# Patient Record
Sex: Female | Born: 1955 | Race: White | Hispanic: No | Marital: Married | Smoking: Former smoker
Health system: Southern US, Community
[De-identification: ages and names within clinical notes are randomized; demographics above are authoritative.]

## PROBLEM LIST (undated history)

## (undated) DIAGNOSIS — F32A Depression, unspecified: Secondary | ICD-10-CM

## (undated) DIAGNOSIS — F419 Anxiety disorder, unspecified: Secondary | ICD-10-CM

## (undated) DIAGNOSIS — E559 Vitamin D deficiency, unspecified: Secondary | ICD-10-CM

## (undated) DIAGNOSIS — G2111 Neuroleptic induced parkinsonism: Secondary | ICD-10-CM

## (undated) HISTORY — DX: Neuroleptic induced parkinsonism: G21.11

## (undated) HISTORY — DX: Anxiety disorder, unspecified: F41.9

## (undated) HISTORY — PX: MOUTH SURGERY: SHX715

## (undated) HISTORY — DX: Vitamin D deficiency, unspecified: E55.9

## (undated) HISTORY — PX: TONSILLECTOMY: SUR1361

## (undated) HISTORY — DX: Depression, unspecified: F32.A

---

## 1999-03-07 ENCOUNTER — Encounter: Payer: Self-pay | Admitting: Obstetrics and Gynecology

## 1999-03-07 ENCOUNTER — Encounter: Admission: RE | Admit: 1999-03-07 | Discharge: 1999-03-07 | Payer: Self-pay | Admitting: Obstetrics and Gynecology

## 2000-05-14 ENCOUNTER — Encounter: Admission: RE | Admit: 2000-05-14 | Discharge: 2000-05-14 | Payer: Self-pay | Admitting: Obstetrics and Gynecology

## 2000-05-14 ENCOUNTER — Encounter: Payer: Self-pay | Admitting: Obstetrics and Gynecology

## 2001-05-18 ENCOUNTER — Encounter: Payer: Self-pay | Admitting: Obstetrics and Gynecology

## 2001-05-18 ENCOUNTER — Encounter: Admission: RE | Admit: 2001-05-18 | Discharge: 2001-05-18 | Payer: Self-pay | Admitting: Obstetrics and Gynecology

## 2002-09-09 ENCOUNTER — Encounter: Payer: Self-pay | Admitting: Obstetrics and Gynecology

## 2002-09-09 ENCOUNTER — Encounter: Admission: RE | Admit: 2002-09-09 | Discharge: 2002-09-09 | Payer: Self-pay | Admitting: Obstetrics and Gynecology

## 2002-09-19 ENCOUNTER — Other Ambulatory Visit: Admission: RE | Admit: 2002-09-19 | Discharge: 2002-09-19 | Payer: Self-pay | Admitting: Obstetrics and Gynecology

## 2003-09-19 ENCOUNTER — Ambulatory Visit (HOSPITAL_COMMUNITY): Admission: RE | Admit: 2003-09-19 | Discharge: 2003-09-19 | Payer: Self-pay | Admitting: Obstetrics and Gynecology

## 2005-01-07 ENCOUNTER — Ambulatory Visit (HOSPITAL_COMMUNITY): Admission: RE | Admit: 2005-01-07 | Discharge: 2005-01-07 | Payer: Self-pay | Admitting: Family Medicine

## 2006-03-18 ENCOUNTER — Ambulatory Visit (HOSPITAL_COMMUNITY): Admission: RE | Admit: 2006-03-18 | Discharge: 2006-03-18 | Payer: Self-pay | Admitting: Obstetrics & Gynecology

## 2008-02-10 ENCOUNTER — Ambulatory Visit (HOSPITAL_COMMUNITY): Admission: RE | Admit: 2008-02-10 | Discharge: 2008-02-10 | Payer: Self-pay | Admitting: Family Medicine

## 2010-05-26 ENCOUNTER — Encounter: Payer: Self-pay | Admitting: Family Medicine

## 2010-08-12 ENCOUNTER — Other Ambulatory Visit (HOSPITAL_COMMUNITY): Payer: Self-pay | Admitting: Family Medicine

## 2010-08-12 DIAGNOSIS — Z1231 Encounter for screening mammogram for malignant neoplasm of breast: Secondary | ICD-10-CM

## 2010-08-20 ENCOUNTER — Ambulatory Visit (HOSPITAL_COMMUNITY): Payer: Self-pay

## 2012-12-20 ENCOUNTER — Other Ambulatory Visit (HOSPITAL_COMMUNITY): Payer: Self-pay | Admitting: Family Medicine

## 2012-12-20 DIAGNOSIS — Z1231 Encounter for screening mammogram for malignant neoplasm of breast: Secondary | ICD-10-CM

## 2012-12-22 ENCOUNTER — Ambulatory Visit (HOSPITAL_COMMUNITY): Payer: Self-pay

## 2013-01-05 ENCOUNTER — Ambulatory Visit (HOSPITAL_COMMUNITY)
Admission: RE | Admit: 2013-01-05 | Discharge: 2013-01-05 | Disposition: A | Discharge status: Home / Self Care | Payer: BC Managed Care – PPO | Source: Ambulatory Visit | Attending: Family Medicine | Admitting: Family Medicine

## 2013-01-05 DIAGNOSIS — Z1231 Encounter for screening mammogram for malignant neoplasm of breast: Secondary | ICD-10-CM

## 2014-08-08 ENCOUNTER — Other Ambulatory Visit (HOSPITAL_COMMUNITY): Payer: Self-pay | Admitting: Family Medicine

## 2014-08-08 DIAGNOSIS — Z1231 Encounter for screening mammogram for malignant neoplasm of breast: Secondary | ICD-10-CM

## 2014-08-21 ENCOUNTER — Ambulatory Visit (HOSPITAL_COMMUNITY)
Admission: RE | Admit: 2014-08-21 | Discharge: 2014-08-21 | Disposition: A | Discharge status: Home / Self Care | Payer: BLUE CROSS/BLUE SHIELD | Source: Ambulatory Visit | Attending: Family Medicine | Admitting: Family Medicine

## 2014-08-21 DIAGNOSIS — Z1231 Encounter for screening mammogram for malignant neoplasm of breast: Secondary | ICD-10-CM | POA: Diagnosis not present

## 2016-07-08 ENCOUNTER — Other Ambulatory Visit: Payer: Self-pay | Admitting: Family Medicine

## 2016-07-08 DIAGNOSIS — Z1231 Encounter for screening mammogram for malignant neoplasm of breast: Secondary | ICD-10-CM

## 2016-07-29 ENCOUNTER — Ambulatory Visit
Admission: RE | Admit: 2016-07-29 | Discharge: 2016-07-29 | Disposition: A | Discharge status: Home / Self Care | Payer: Commercial Managed Care - HMO | Source: Ambulatory Visit | Attending: Family Medicine | Admitting: Family Medicine

## 2016-07-29 DIAGNOSIS — Z1231 Encounter for screening mammogram for malignant neoplasm of breast: Secondary | ICD-10-CM

## 2017-06-22 ENCOUNTER — Other Ambulatory Visit: Payer: Self-pay | Admitting: Family Medicine

## 2017-06-22 DIAGNOSIS — Z139 Encounter for screening, unspecified: Secondary | ICD-10-CM

## 2017-06-22 DIAGNOSIS — E2839 Other primary ovarian failure: Secondary | ICD-10-CM

## 2017-07-30 ENCOUNTER — Other Ambulatory Visit: Payer: Commercial Managed Care - HMO

## 2017-07-30 ENCOUNTER — Ambulatory Visit: Payer: Commercial Managed Care - HMO

## 2017-07-31 ENCOUNTER — Other Ambulatory Visit: Payer: Self-pay | Admitting: Internal Medicine

## 2017-08-03 ENCOUNTER — Ambulatory Visit
Admission: RE | Admit: 2017-08-03 | Discharge: 2017-08-03 | Disposition: A | Discharge status: Home / Self Care | Payer: BLUE CROSS/BLUE SHIELD | Source: Ambulatory Visit | Attending: Family Medicine | Admitting: Family Medicine

## 2017-08-03 ENCOUNTER — Ambulatory Visit
Admission: RE | Admit: 2017-08-03 | Discharge: 2017-08-03 | Disposition: A | Discharge status: Home / Self Care | Payer: Commercial Managed Care - HMO | Source: Ambulatory Visit | Attending: Family Medicine | Admitting: Family Medicine

## 2017-08-03 DIAGNOSIS — E2839 Other primary ovarian failure: Secondary | ICD-10-CM

## 2017-08-03 DIAGNOSIS — Z139 Encounter for screening, unspecified: Secondary | ICD-10-CM

## 2017-08-04 ENCOUNTER — Other Ambulatory Visit: Payer: Self-pay | Admitting: Family Medicine

## 2018-10-21 IMAGING — MG DIGITAL SCREENING BILATERAL MAMMOGRAM WITH TOMO AND CAD
8 series · 9 of 24 positions shown · non-contrast
Comparison: Previous exam(s).

CLINICAL DATA: Screening.

EXAM:
DIGITAL SCREENING BILATERAL MAMMOGRAM WITH TOMO AND CAD

[L MLO synth-2D]
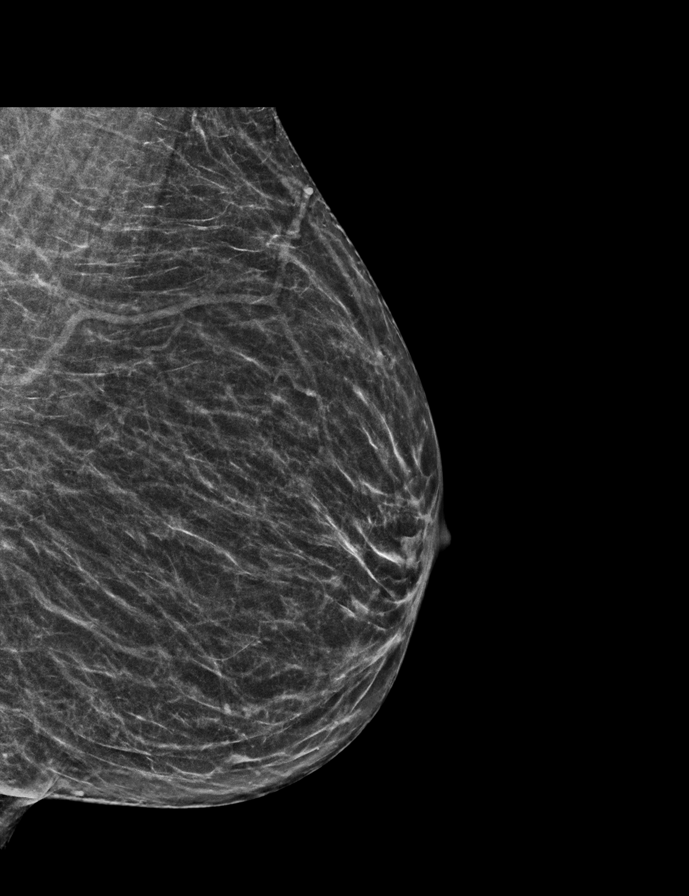

[L CC synth-2D]
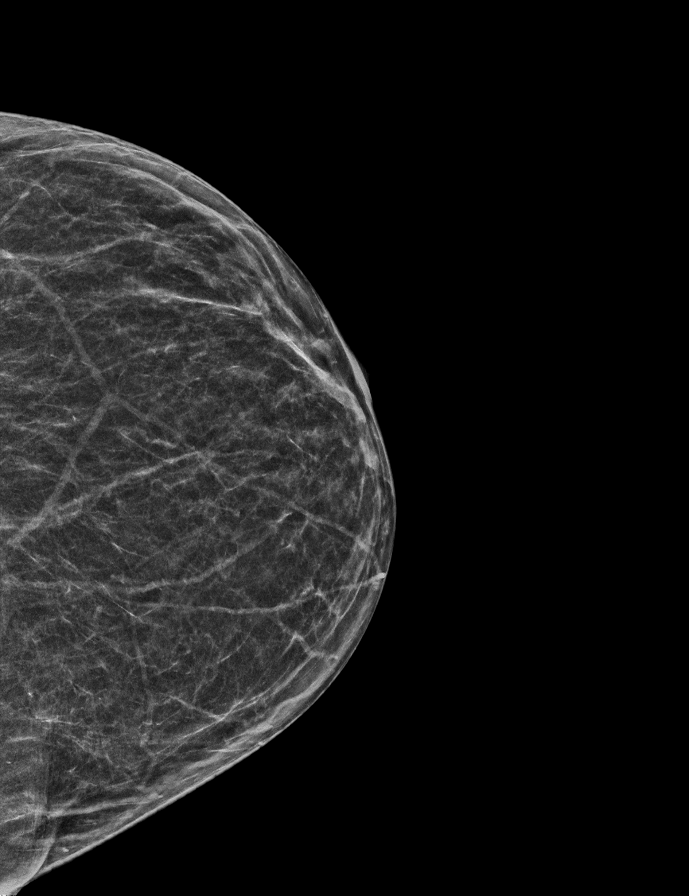

[R MLO synth-2D]
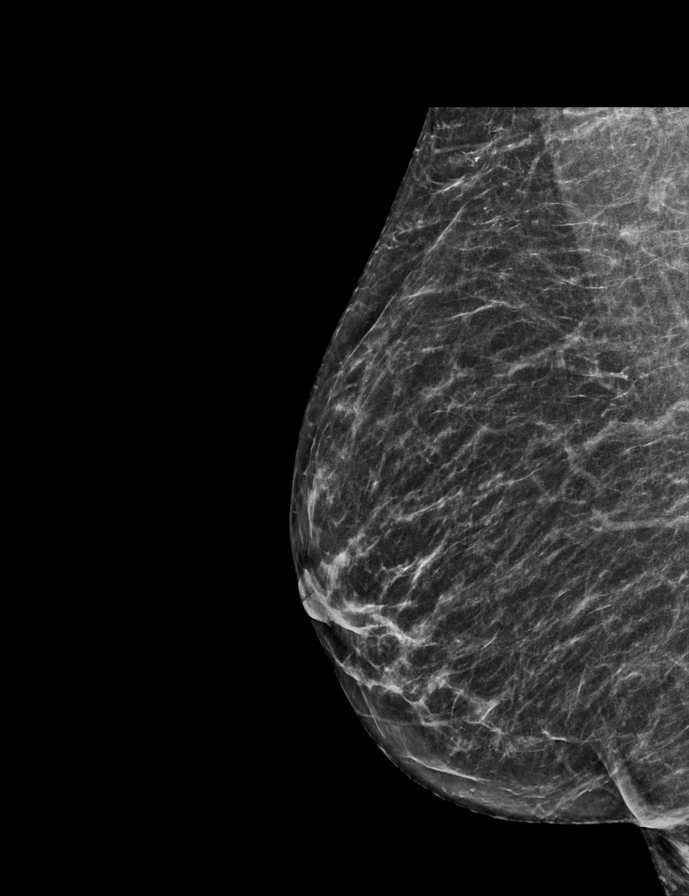

[R CC synth-2D]
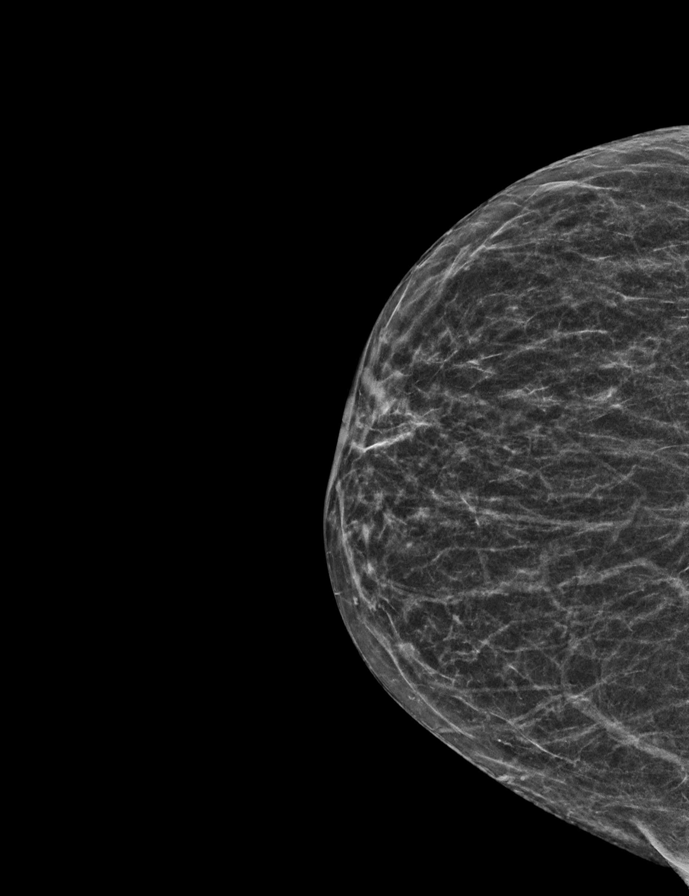

[L MLO tomo · 2 of 51 frames shown]
[frame 17/51]
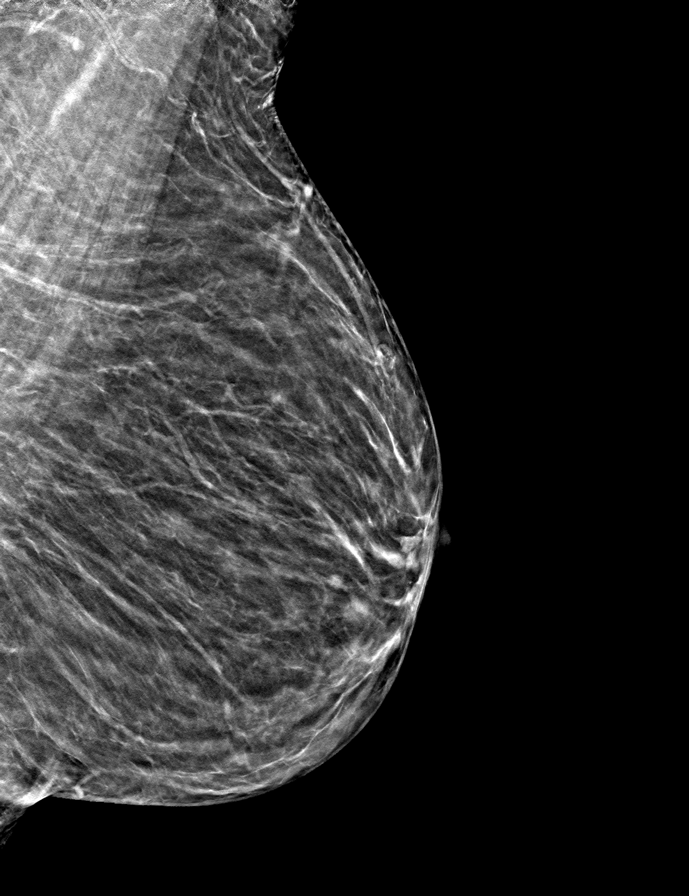
[frame 26/51]
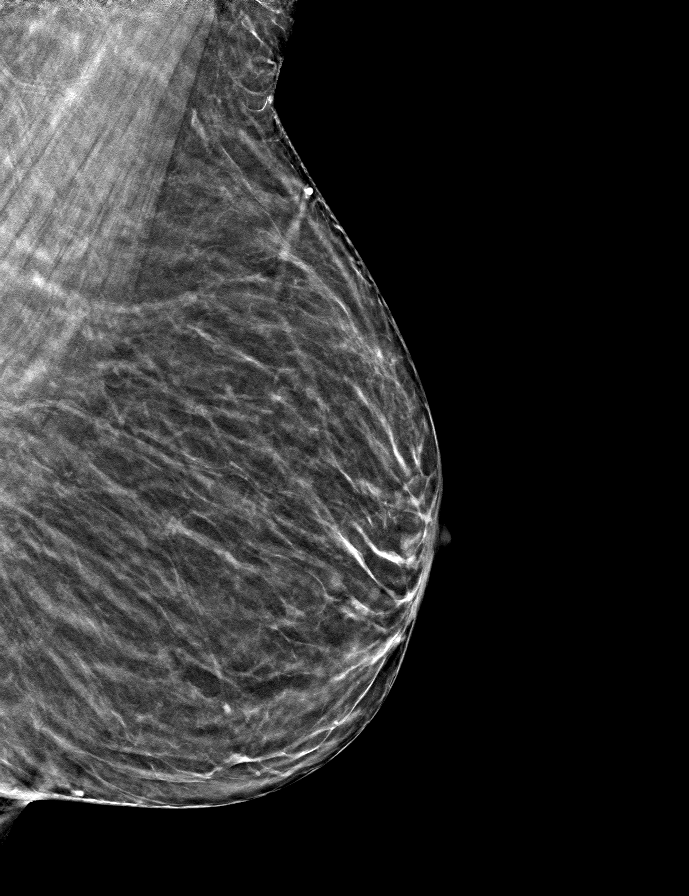

[R CC tomo · tomo slice 23/46.0]
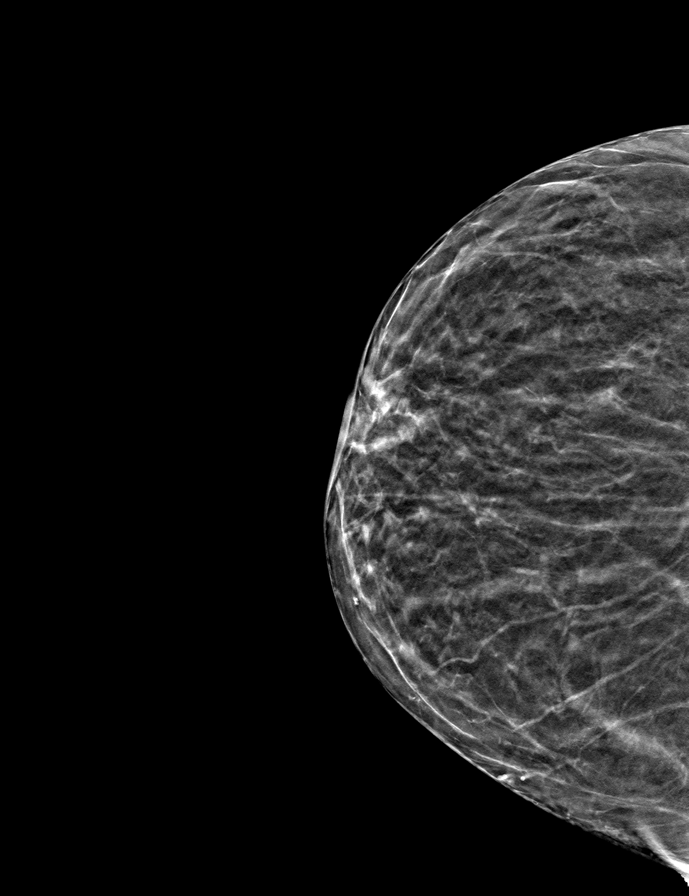

[R MLO tomo · tomo slice 27/54.0]
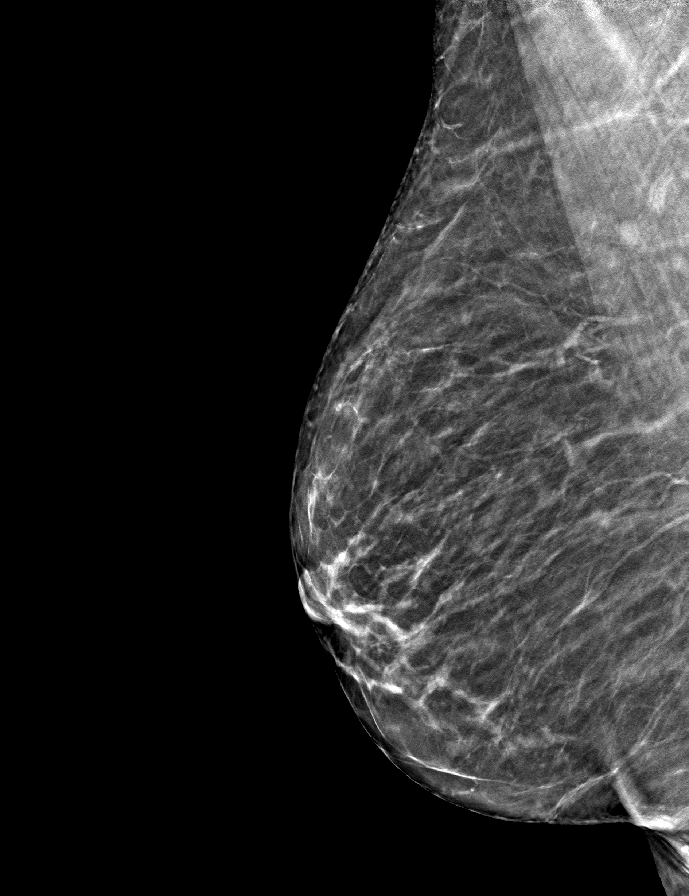

[L CC tomo · tomo slice 24/47.0]
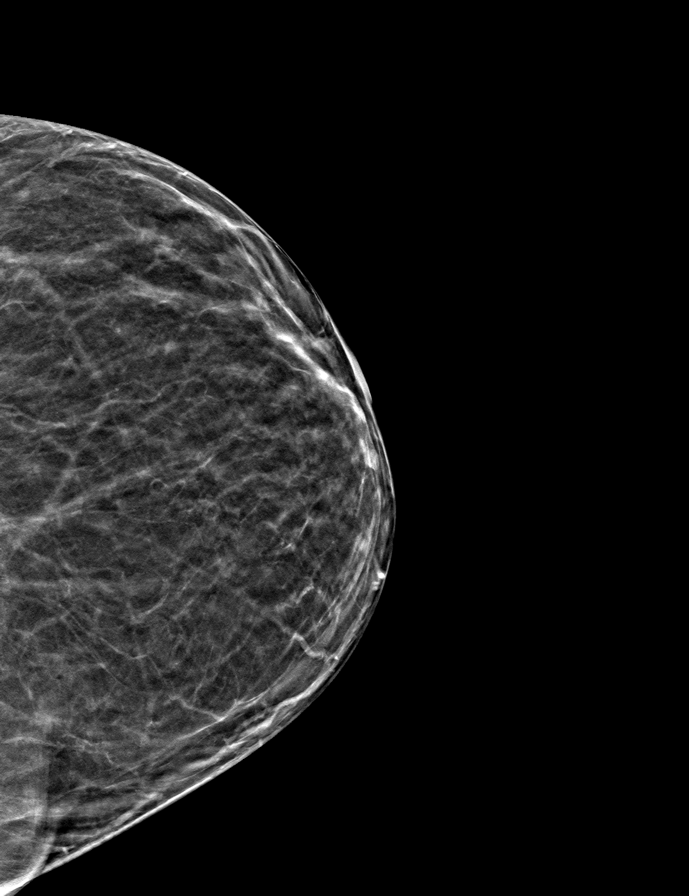

[9 of 24 positions shown; findings below may reference images not displayed]

ACR Breast Density Category b: There are scattered areas of
fibroglandular density.
FINDINGS: There are no findings suspicious for malignancy. Images were
processed with CAD.
IMPRESSION: No mammographic evidence of malignancy. A result letter of this
screening mammogram will be mailed directly to the patient.

RECOMMENDATION:
Screening mammogram in one year. (Code:[id number])

BI-RADS CATEGORY  1: Negative.

## 2021-10-15 ENCOUNTER — Encounter: Payer: Self-pay | Admitting: Neurology

## 2021-10-25 NOTE — Progress Notes (Signed)
Assessment/Plan:   Parkinsonism  -I had a long counseling session with the patient today.  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  I explained that one clinically cannot tell the difference between idiopathic parkinsons disease and secondary parkinsonism from medication.  However, she has dyskinesia on low dose levodopa and while she could also have this as extrapyramidal movement from latuda, she really thinks that it occurs after taking levodopa every day, and I believe her.  With early low-dose dyskinesia, one really needs to rule out that there is a secondary issue going on, and I think that a DaTscan would help [place] rule out a presynaptic versus postsynaptic issue, versus both.  She finds levodopa very beneficial and would like to stay on the medicine, but I think it is important for prognosis to look at whether or not she actually has a neurodegenerative process, or whether this is purely secondary parkinsonism.  She agrees and we will try to get a DaTscan scheduled.   -She needs to continue to follow-up with psychiatry, Dr. Madaline Guthrie.  Patient reports that she has tried to get off of Latuda without success.   -Patient asks about inconsistency with levodopa.  This is likely because she is taking the older extended release version of the medication.  She had more dyskinesia with the immediate release version.  We talked about the addition of entacapone versus Rytary.  We can discuss options in the future after her DaTscan -Regardless of results of the above, patient likely would benefit from some Parkinson's physical therapy.  We can talk about this after the DaTscan.   Subjective:   Diane Patrick was seen today in the movement disorders clinic for neurologic consultation at the request of Richmond Campbell., PA-C.  The consultation is for the evaluation of parkinsonism.  Today's visit is for a second opinion.  Patient currently  under the care of Dr. Antonietta Barcelona.  Medical records are reviewed.  Patient for started seeing Dr. Antonietta Barcelona November 26, 2020.  Notes indicate that patient had been on Abilify for 8 to 10 years and was now on [place].  It was thought she had secondary parkinsonism.  Levodopa was added.  She was initially on immediate release, but he changed her to extended release due to "complaints of jerking as of her legs (patient states dyskinesia today)."  Patient was last seen by Dr. Antonietta Barcelona August 22, 2021.  This was a telehealth visit.  Her levodopa was increased to 4 times daily dosing.  She states that she didn't do well with this and the patient states that "this caused a whole lot more dyskinesia."  Pt states that "when I got to Dr. Antonietta Barcelona, I couldn't move."  She states that she had little steps.  She states that the addition of levodopa made a marked difference but the dyskinesia started within a month.  She states that the parkinsonism had been going on about 6 years prior to seeing neurology and she had pretty slow progression of it.  She notes that levodopa that levodopa may turn off - yesterday it was off before 10:30am.  Levodopa seems inconsistent in release.    carbidopa/levodopa 25/100 CR, 1 at 6am/11am /4pm (went on this one b/c increase dyskinesia on immediate release)   Specific Symptoms:  Tremor: No. Family hx of similar:  No. Voice: no changes Sleep: sleeps well  Vivid Dreams:  No.  Acting out dreams:  No. Wet Pillows:  No. Postural symptoms:  the carbidopa fixed the balance problem  Falls?  None in 9 months Bradykinesia symptoms: no bradykinesia noted Loss of smell:  Yes.   Loss of taste:  No. Urinary Incontinence:  No. Difficulty Swallowing:  No. Handwriting, micrographia: No. Trouble with ADL's:  No.  Trouble buttoning clothing: No. Depression:  "Mood is great on latuda" Memory changes:  No. Hallucinations:  No.  visual distortions: No. N/V:  No. Lightheaded:  No.  Syncope: No. Diplopia:   No. Dyskinesia:  Yes.   , "almost confined to the feet" - bilaterally Prior exposure to reglan/antipsychotics: Yes.   (Currently on Abilify, previously on Latuda)   PREVIOUS MEDICATIONS: Sinemet  ALLERGIES:   Allergies  Allergen Reactions   Aripiprazole Other (See Comments)    Reduce muscle strength and dizziness    CURRENT MEDICATIONS:  Current Meds  Medication Sig   alendronate (FOSAMAX) 70 MG tablet Take by mouth.   Carbidopa-Levodopa ER (SINEMET CR) 25-100 MG tablet controlled release Take 1 tablet by mouth 3 (three) times daily.   desvenlafaxine (PRISTIQ) 100 MG 24 hr tablet TAKE 1 TABLET BY MOUTH EVERY MORNING WITH FOOD   EPINEPHrine 0.3 mg/0.3 mL IJ SOAJ injection Inject into the muscle.   lurasidone (LATUDA) 20 MG TABS tablet Take 1 tablet by mouth daily.   Multiple Vitamins-Minerals (MULTIVITAMIN ADULT EXTRA C PO) Take 1 capsule by mouth daily.     Objective:   VITALS:   Vitals:   10/29/21 0935  BP: (!) 142/82  Pulse: 83  SpO2: 90%  Weight: 115 lb (52.2 kg)  Height: 5\' 5"  (1.651 m)    GEN:  The patient appears stated age and is in NAD. HEENT:  Normocephalic, atraumatic.  The mucous membranes are moist. The superficial temporal arteries are without ropiness or tenderness. CV:  RRR Lungs:  CTAB Neck/HEME:  There are no carotid bruits bilaterally.  Neurological examination:  Orientation: The patient is alert and oriented x3.  Cranial nerves: There is good facial symmetry. Extraocular muscles are intact. The visual fields are full to confrontational testing. The speech is fluent and clear. Soft palate rises symmetrically and there is no tongue deviation. Hearing is intact to conversational tone. Sensation: Sensation is intact to light and pinprick throughout (facial, trunk, extremities). Vibration is intact at the bilateral big toe. There is no extinction with double simultaneous stimulation. There is no sensory dermatomal level identified. Motor: Strength is 5/5  in the bilateral upper and lower extremities.   Shoulder shrug is equal and symmetric.  There is no pronator drift. Deep tendon reflexes: Deep tendon reflexes are 2/4 at the bilateral biceps, triceps, brachioradialis, patella and achilles. Plantar responses are downgoing bilaterally.  Movement examination: Tone: There is normal tone in the bilateral upper extremities.  The tone in the lower extremities is normal.  Abnormal movements: there is dyskinesia R>LLE Coordination:  There is no decremation with RAM's, with any form of RAMS, including alternating supination and pronation of the forearm, hand opening and closing, finger taps, heel taps and toe taps. Gait and Station: The patient has no difficulty arising out of a deep-seated chair without the use of the hands.  The patient drags the right leg and lifts it up in a marching type of gait.      Total time spent on today's visit was 74 minutes, including both face-to-face time and nonface-to-face time.  Time included that spent on review of records (prior notes available to me/labs/imaging if pertinent), discussing treatment and goals,  answering patient's questions and coordinating care.  Cc:  Richmond Campbell., PA-C

## 2021-10-29 ENCOUNTER — Encounter: Payer: Self-pay | Admitting: Neurology

## 2021-10-29 ENCOUNTER — Ambulatory Visit (INDEPENDENT_AMBULATORY_CARE_PROVIDER_SITE_OTHER): Payer: Medicare Other | Admitting: Neurology

## 2021-10-29 VITALS — BP 142/82 | HR 83 | Ht 65.0 in | Wt 115.0 lb

## 2021-10-29 DIAGNOSIS — R251 Tremor, unspecified: Secondary | ICD-10-CM

## 2021-10-29 DIAGNOSIS — G249 Dystonia, unspecified: Secondary | ICD-10-CM | POA: Diagnosis not present

## 2021-10-29 DIAGNOSIS — G2111 Neuroleptic induced parkinsonism: Secondary | ICD-10-CM | POA: Diagnosis not present

## 2021-10-29 DIAGNOSIS — G20 Parkinson's disease: Secondary | ICD-10-CM

## 2022-04-24 ENCOUNTER — Telehealth: Payer: Self-pay | Admitting: Neurology

## 2022-04-24 NOTE — Telephone Encounter (Signed)
Diane Patrick, she has a f/u first day after the new year.  Her f/u was to f/u on DaT scan.  She declined all testing (DaT/skin biopsy).  She doesn't therefore need f/u here.  Looks like she has asked her pcp to fill medication.

## 2022-04-24 NOTE — Telephone Encounter (Signed)
I spoke with patient and patient states that she does not need appt and so I got the appt canceled

## 2022-05-06 ENCOUNTER — Ambulatory Visit: Payer: Medicare Other | Admitting: Neurology

## 2022-06-14 LAB — COLOGUARD: COLOGUARD: NEGATIVE
